# Patient Record
Sex: Male | Born: 2000 | Hispanic: No | Marital: Single | Smoking: Never smoker
Health system: Southern US, Community
[De-identification: ages and names within clinical notes are randomized; demographics above are authoritative.]

## PROBLEM LIST (undated history)

## (undated) DIAGNOSIS — F909 Attention-deficit hyperactivity disorder, unspecified type: Secondary | ICD-10-CM

---

## 2020-01-06 ENCOUNTER — Ambulatory Visit: Payer: Self-pay

## 2020-01-09 ENCOUNTER — Ambulatory Visit: Payer: Self-pay | Attending: Internal Medicine

## 2020-01-09 DIAGNOSIS — Z23 Encounter for immunization: Secondary | ICD-10-CM

## 2020-01-09 NOTE — Progress Notes (Signed)
   Covid-19 Vaccination [facility]  Name:  Christopher Parker    MRN: [id number] DOB: [birth year]  01/09/2020  [name] was observed post Covid-19 immunization for 15 minutes without incident. He was provided with Vaccine Information Sheet and instruction to access the V-Safe system.   [name] was instructed to call 911 with any severe reactions post vaccine: Marland Kitchen Difficulty breathing  . Swelling of face and throat  . A fast heartbeat  . A bad rash all over body  . Dizziness and weakness   Immunizations Administered    Name Date Dose VIS Date Route   JANSSEN COVID-19 VACCINE 01/09/2020  3:46 PM 0.5 mL 08/17/2019 Intramuscular   Manufacturer: [employer]   Lot: [id number]   NDC: 09323-557-32

## 2020-09-03 ENCOUNTER — Other Ambulatory Visit: Payer: Self-pay

## 2020-09-03 ENCOUNTER — Ambulatory Visit (INDEPENDENT_AMBULATORY_CARE_PROVIDER_SITE_OTHER): Payer: BC Managed Care – PPO

## 2020-09-03 ENCOUNTER — Ambulatory Visit (HOSPITAL_COMMUNITY)
Admission: EM | Admit: 2020-09-03 | Discharge: 2020-09-03 | Disposition: A | Discharge status: Home / Self Care | Payer: BC Managed Care – PPO

## 2020-09-03 ENCOUNTER — Encounter (HOSPITAL_COMMUNITY): Payer: Self-pay | Admitting: Emergency Medicine

## 2020-09-03 DIAGNOSIS — M79641 Pain in right hand: Secondary | ICD-10-CM

## 2020-09-03 DIAGNOSIS — S60221A Contusion of right hand, initial encounter: Secondary | ICD-10-CM | POA: Diagnosis not present

## 2020-09-03 HISTORY — DX: Attention-deficit hyperactivity disorder, unspecified type: F90.9

## 2020-09-03 NOTE — Discharge Instructions (Signed)
-  Keep your fingers buddy taped while you are having pain. -Follow-up with orthopedist in about 5 to 7 days if you can experience continued pain and swelling.  Information below. -Try ice, Tylenol/ibuprofen.

## 2020-09-03 NOTE — ED Triage Notes (Signed)
Pt states that a door closed on his right hand. Pt right hand does have discoloration and swelling.

## 2020-09-03 NOTE — ED Provider Notes (Signed)
[facility]    CSN: [id number] Arrival date & time: 09/03/20  1840      History   Chief Complaint No chief complaint on file.   HPI Christopher Parker is a 20 y.o. male presenting for right hand injury following slamming hand in door earlier today. States he is having pain over lateral R hand, particularly over base of pinkie finger. 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 Denies pain or injury elsewhere, sensation changes. He is left handed.    HPI  Past Medical History:  Diagnosis Date  . ADHD     There are no problems to display for this patient.   History reviewed. No pertinent surgical history.     Home Medications    Prior to Admission medications   Medication Sig Start Date End Date Taking? Authorizing Provider  Methylphenidate HCl ER 54 MG TB24 Take by mouth.    [provider]    Family History History reviewed. No pertinent family history.  Social History Social History   Tobacco Use  . Smoking status: Never Smoker  . Smokeless tobacco: Never Used  Vaping Use  . Vaping Use: Never used  Substance Use Topics  . Alcohol use: Never  . Drug use: Never     Allergies   Patient has no known allergies.   Review of Systems Review of Systems  Musculoskeletal:       R hand pain   All other systems reviewed and are negative.    Physical Exam Triage Vital Signs ED Triage Vitals  Enc Vitals Group     BP 09/03/20 1856 125/86     Pulse Rate 09/03/20 1856 79     Resp 09/03/20 1856 18     Temp 09/03/20 1856 98.6 F (37 C)     Temp Source 09/03/20 1856 Oral     SpO2 09/03/20 1856 96 %     Weight --      Height --      Head Circumference --      Peak Flow --      Pain Score 09/03/20 1858 6     Pain Loc --      Pain Edu? --      Excl. in GC? --    No data found.  Updated Vital Signs BP 125/86 (BP Location: Left Arm)   Pulse 79   Temp 98.6 F (37 C) (Oral)   Resp 18    SpO2 96%   Visual Acuity Right Eye Distance:   Left Eye Distance:   Bilateral Distance:    Right Eye Near:   Left Eye Near:    Bilateral Near:     Physical Exam Vitals reviewed.  Constitutional:      Appearance: Normal appearance.  HENT:     Head: Normocephalic and atraumatic.  Cardiovascular:     Rate and Rhythm: Normal rate and regular rhythm.     Heart sounds: Normal heart sounds.  Pulmonary:     Effort: Pulmonary effort is normal.     Breath sounds: Normal breath sounds.  Musculoskeletal:     Comments: R hand with pain, scant erythema, mild swelling over 5th finger MCP joint. TTP. No tenderness or deformity over Metacarpal bones or phalanges. ROM fingers intact and without pain. Grip strength 5/5. Cap refill <2 seconds. Radial pulse 2+. neurovascularly intact.  No other injury, deformity, abrasion, ecchymosis, tenderness.  Skin:    Capillary Refill: Capillary refill  takes less than 2 seconds.  Neurological:     General: No focal deficit present.     Mental Status: He is alert and oriented to person, place, and time.  Psychiatric:        Mood and Affect: Mood normal.        Behavior: Behavior normal.        Thought Content: Thought content normal.        Judgment: Judgment normal.      UC Treatments / Results  Labs (all labs ordered are listed, but only abnormal results are displayed) Labs Reviewed - No data to display  EKG   Radiology DG Hand Complete Right  Result Date: 09/03/2020 CLINICAL DATA:  Swelling EXAM: RIGHT HAND - COMPLETE 3+ VIEW COMPARISON:  None. FINDINGS: There is no evidence of fracture or dislocation. There is no evidence of arthropathy or other focal bone abnormality. Soft tissues are unremarkable. IMPRESSION: Negative. Electronically Signed   By: Jasmine Pang M.D.   On: 09/03/2020 19:25    Procedures Procedures (including critical care time)  Medications Ordered in UC Medications - No data to display  Initial Impression / Assessment  and Plan / UC Course  I have reviewed the triage vital signs and the nursing notes.  Pertinent labs & imaging results that were available during my care of the patient were reviewed by me and considered in my medical decision making (see chart for details).     This patient is a 20 year old male presenting for right hand pain following slamming hand in door.   Xray R hand negative Films interpreted by myself and radiologist.   Buddy taped 4th and 5th fingers. RICE. F/u with ortho if symptoms persist in 5-7 days, or sooner if symptoms worsen. Return precautions discussed. Work note provided.  This chart was dictated using voice recognition software, Dragon. Despite the best efforts of this provider to proofread and correct errors, errors may still occur which can change documentation meaning.   Final Clinical Impressions(s) / UC Diagnoses   Final diagnoses:  Contusion of right hand, initial encounter     Discharge Instructions     -Keep your fingers buddy taped while you are having pain. -Follow-up with orthopedist in about 5 to 7 days if you can experience continued pain and swelling.  Information below. -Try ice, Tylenol/ibuprofen.    ED Prescriptions    None     PDMP not reviewed this encounter.   Rhys Martini, PA-C 09/04/20 1001

## 2020-12-01 ENCOUNTER — Encounter: Payer: Self-pay | Admitting: Emergency Medicine

## 2020-12-01 ENCOUNTER — Other Ambulatory Visit: Payer: Self-pay

## 2020-12-01 ENCOUNTER — Ambulatory Visit
Admission: EM | Admit: 2020-12-01 | Discharge: 2020-12-01 | Disposition: A | Discharge status: Home / Self Care | Payer: BC Managed Care – PPO | Attending: Emergency Medicine | Admitting: Emergency Medicine

## 2020-12-01 DIAGNOSIS — Z113 Encounter for screening for infections with a predominantly sexual mode of transmission: Secondary | ICD-10-CM | POA: Insufficient documentation

## 2020-12-01 DIAGNOSIS — R300 Dysuria: Secondary | ICD-10-CM | POA: Diagnosis present

## 2020-12-01 LAB — POCT URINALYSIS DIP (MANUAL ENTRY)
Bilirubin, UA: NEGATIVE
Blood, UA: NEGATIVE
Glucose, UA: NEGATIVE mg/dL
Leukocytes, UA: NEGATIVE
Nitrite, UA: NEGATIVE
Protein Ur, POC: 30 mg/dL — AB
Spec Grav, UA: 1.025 (ref 1.010–1.025)
Urobilinogen, UA: 1 E.U./dL
pH, UA: 7 (ref 5.0–8.0)

## 2020-12-01 NOTE — ED Triage Notes (Signed)
Pt presents with c/o of right flank pain that began approx 1 week ago,+odor, denies discharge.

## 2020-12-01 NOTE — ED Provider Notes (Signed)
[name]    CSN: [id number] Arrival date & time: 12/01/20  1410      History   Chief Complaint Chief Complaint  Patient presents with   Dysuria   Flank Pain    right    HPI Bridget Westbrooks is a 20 y.o. male.  Patient presents with malodorous urine, dysuria, right flank pain x1 week.  He denies fever, chills, hematuria, penile discharge, testicular pain, abdominal pain, rash, lesions, or other symptoms.  No treatments attempted at home.  He is sexually active and does not consistently use condoms.  He denies pertinent medical history.  The history is provided by the patient.   Past Medical History:  Diagnosis Date   ADHD     There are no problems to display for this patient.   History reviewed. No pertinent surgical history.     Home Medications    Prior to Admission medications   Medication Sig Start Date End Date Taking? Authorizing Provider  Methylphenidate HCl ER 54 MG TB24 Take by mouth.   Yes [provider]    Family History Family History  Problem Relation Age of Onset   Diabetes Mother     Social History Social History   Tobacco Use   Smoking status: Never   Smokeless tobacco: Never  Vaping Use   Vaping Use: Never used  Substance Use Topics   Alcohol use: Never   Drug use: Never     Allergies   Patient has no known allergies.   Review of Systems Review of Systems  Constitutional:  Negative for chills and fever.  Respiratory:  Negative for cough and shortness of breath.   Cardiovascular:  Negative for chest pain and palpitations.  Gastrointestinal:  Negative for abdominal pain and vomiting.  Genitourinary:  Positive for dysuria and flank pain. Negative for hematuria, penile discharge and testicular pain.  Skin:  Negative for color change and rash.  All other systems reviewed and are negative.   Physical Exam Triage Vital Signs ED Triage Vitals  Enc Vitals Group     BP      Pulse      Resp      Temp       Temp src      SpO2      Weight      Height      Head Circumference      Peak Flow      Pain Score      Pain Loc      Pain Edu?      Excl. in GC?    No data found.  Updated Vital Signs BP 137/80 (BP Location: Right Arm)   Pulse 86   Temp 97.8 F (36.6 C) (Oral)   Resp 17   Ht 5\' 10"  (1.778 m)   Wt 128 lb (58.1 kg)   SpO2 98%   BMI 18.37 kg/m   Visual Acuity Right Eye Distance:   Left Eye Distance:   Bilateral Distance:    Right Eye Near:   Left Eye Near:    Bilateral Near:     Physical Exam Vitals and nursing note reviewed.  Constitutional:      General: He is not in acute distress.    Appearance: He is well-developed. He is not ill-appearing.  HENT:     Head: Normocephalic and atraumatic.     Mouth/Throat:     Mouth: Mucous membranes are moist.  Eyes:     Conjunctiva/sclera: Conjunctivae  normal.  Cardiovascular:     Rate and Rhythm: Normal rate and regular rhythm.     Heart sounds: Normal heart sounds. No murmur heard. Pulmonary:     Effort: Pulmonary effort is normal. No respiratory distress.     Breath sounds: Normal breath sounds.  Abdominal:     General: Bowel sounds are normal.     Palpations: Abdomen is soft.     Tenderness: There is no abdominal tenderness. There is no right CVA tenderness, left CVA tenderness, guarding or rebound.  Musculoskeletal:     Cervical back: Neck supple.  Skin:    General: Skin is warm and dry.  Neurological:     General: No focal deficit present.     Mental Status: He is alert and oriented to person, place, and time.     Gait: Gait normal.  Psychiatric:        Mood and Affect: Mood normal.        Behavior: Behavior normal.     UC Treatments / Results  Labs (all labs ordered are listed, but only abnormal results are displayed) Labs Reviewed  POCT URINALYSIS DIP (MANUAL ENTRY) - Abnormal; Notable for the following components:      Result Value   Ketones, POC UA trace (5) (*)    Protein Ur, POC =30 (*)     All other components within normal limits  CYTOLOGY, (ORAL, ANAL, URETHRAL) ANCILLARY ONLY    EKG   Radiology No results found.  Procedures Procedures (including critical care time)  Medications Ordered in UC Medications - No data to display  Initial Impression / Assessment and Plan / UC Course  I have reviewed the triage vital signs and the nursing notes.  Pertinent labs & imaging results that were available during my care of the patient were reviewed by me and considered in my medical decision making (see chart for details).  Dysuria, STD screening.  Urine does not show signs of infection.  Patient obtained penile self swab for STD testing.  Instructed him to abstain from sexual activity for least 7 days.  Discussed that we will call him if his STD tests are positive requiring treatment.  Discussed that he and his sexual partners may require treatment at that time.  Instructed him to follow-up with his PCP if his symptoms are not improving.  He agrees to plan of care.   Final Clinical Impressions(s) / UC Diagnoses   Final diagnoses:  Dysuria  Screening for STD (sexually transmitted disease)     Discharge Instructions      Your urine does not show signs of infection.    Your STD tests are pending.  If your test results are positive, we will call you.  You and your sexual partner(s) may require treatment at that time.  Do not have sexual activity for at least 7 days.    Follow up with your primary care provider if your symptoms are not improving.         ED Prescriptions   None    PDMP not reviewed this encounter.   Mickie Bail, NP 12/01/20 1450

## 2020-12-01 NOTE — Discharge Instructions (Addendum)
Your urine does not show signs of infection.    Your STD tests are pending.  If your test results are positive, we will call you.  You and your sexual partner(s) may require treatment at that time.  Do not have sexual activity for at least 7 days.    Follow up with your primary care provider if your symptoms are not improving.

## 2020-12-03 LAB — CYTOLOGY, (ORAL, ANAL, URETHRAL) ANCILLARY ONLY
Chlamydia: NEGATIVE
Comment: NEGATIVE
Comment: NEGATIVE
Comment: NORMAL
Neisseria Gonorrhea: NEGATIVE
Trichomonas: NEGATIVE

## 2022-02-24 IMAGING — DX DG HAND COMPLETE 3+V*R*
3 series · 3 of 3 positions shown · non-contrast
Comparison: None.

CLINICAL DATA: Swelling

EXAM:
RIGHT HAND - COMPLETE 3+ VIEW

[hand pa]
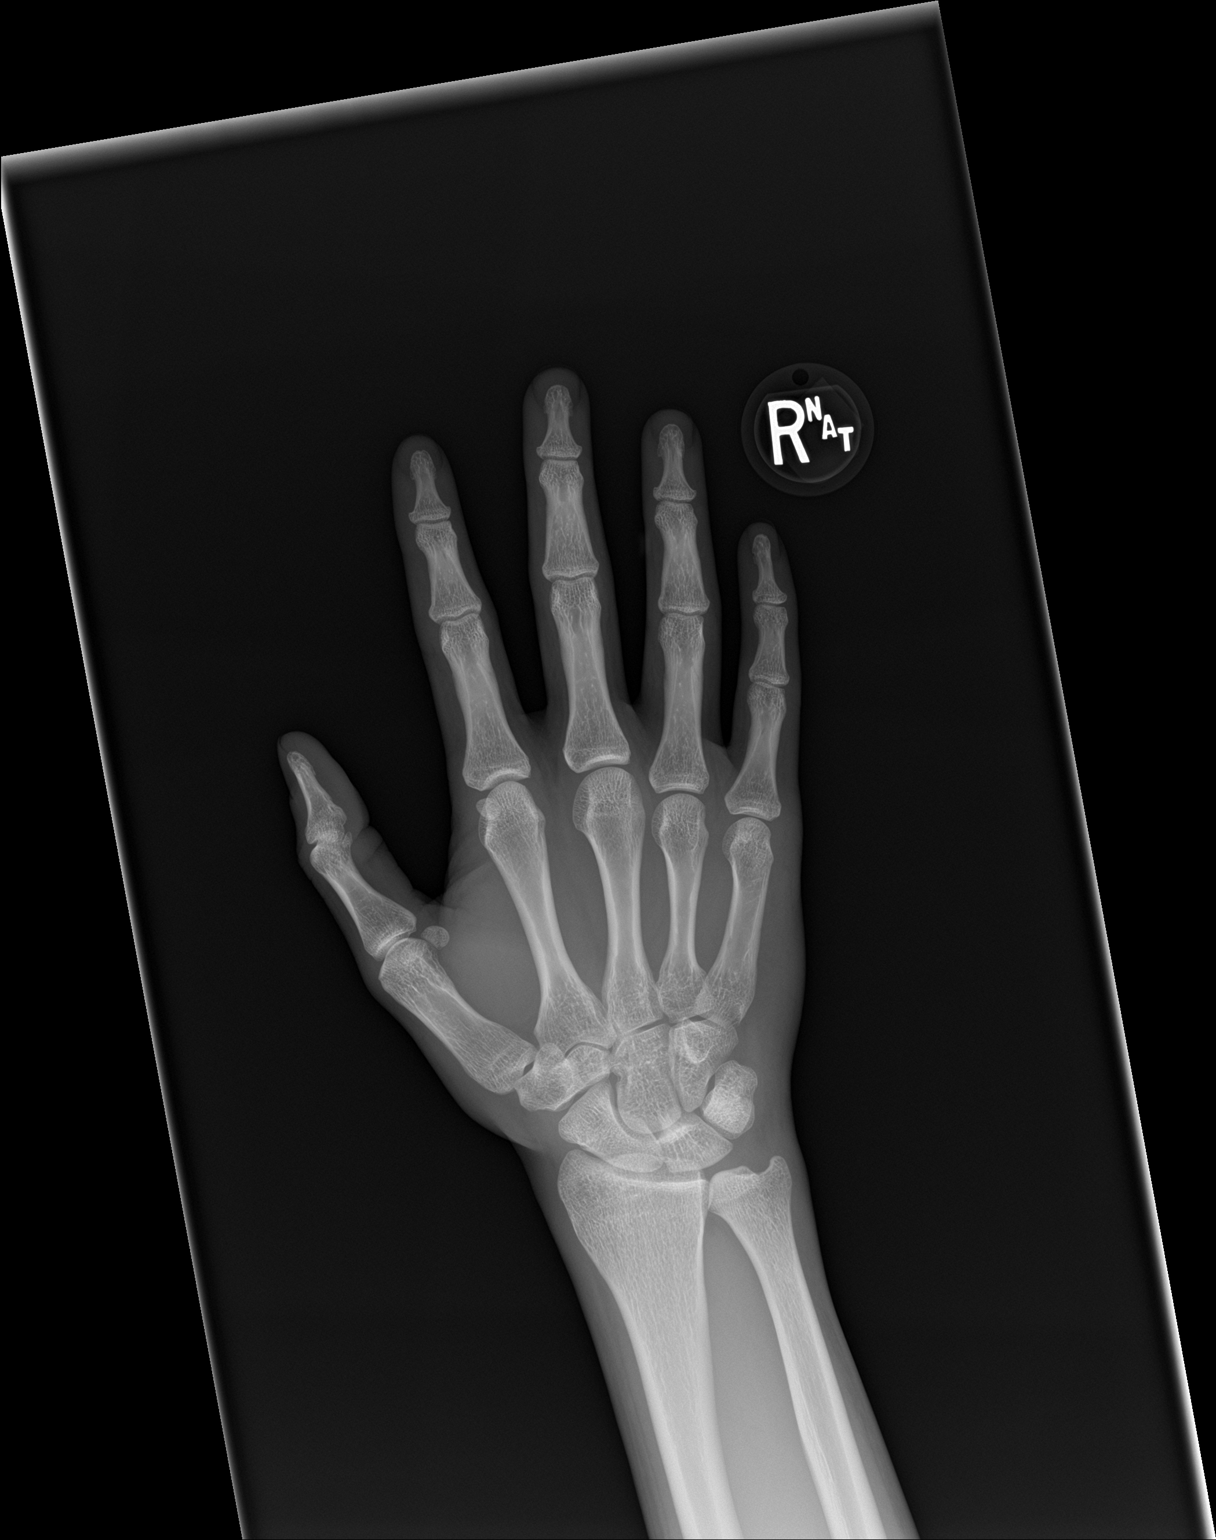

[hand obl]
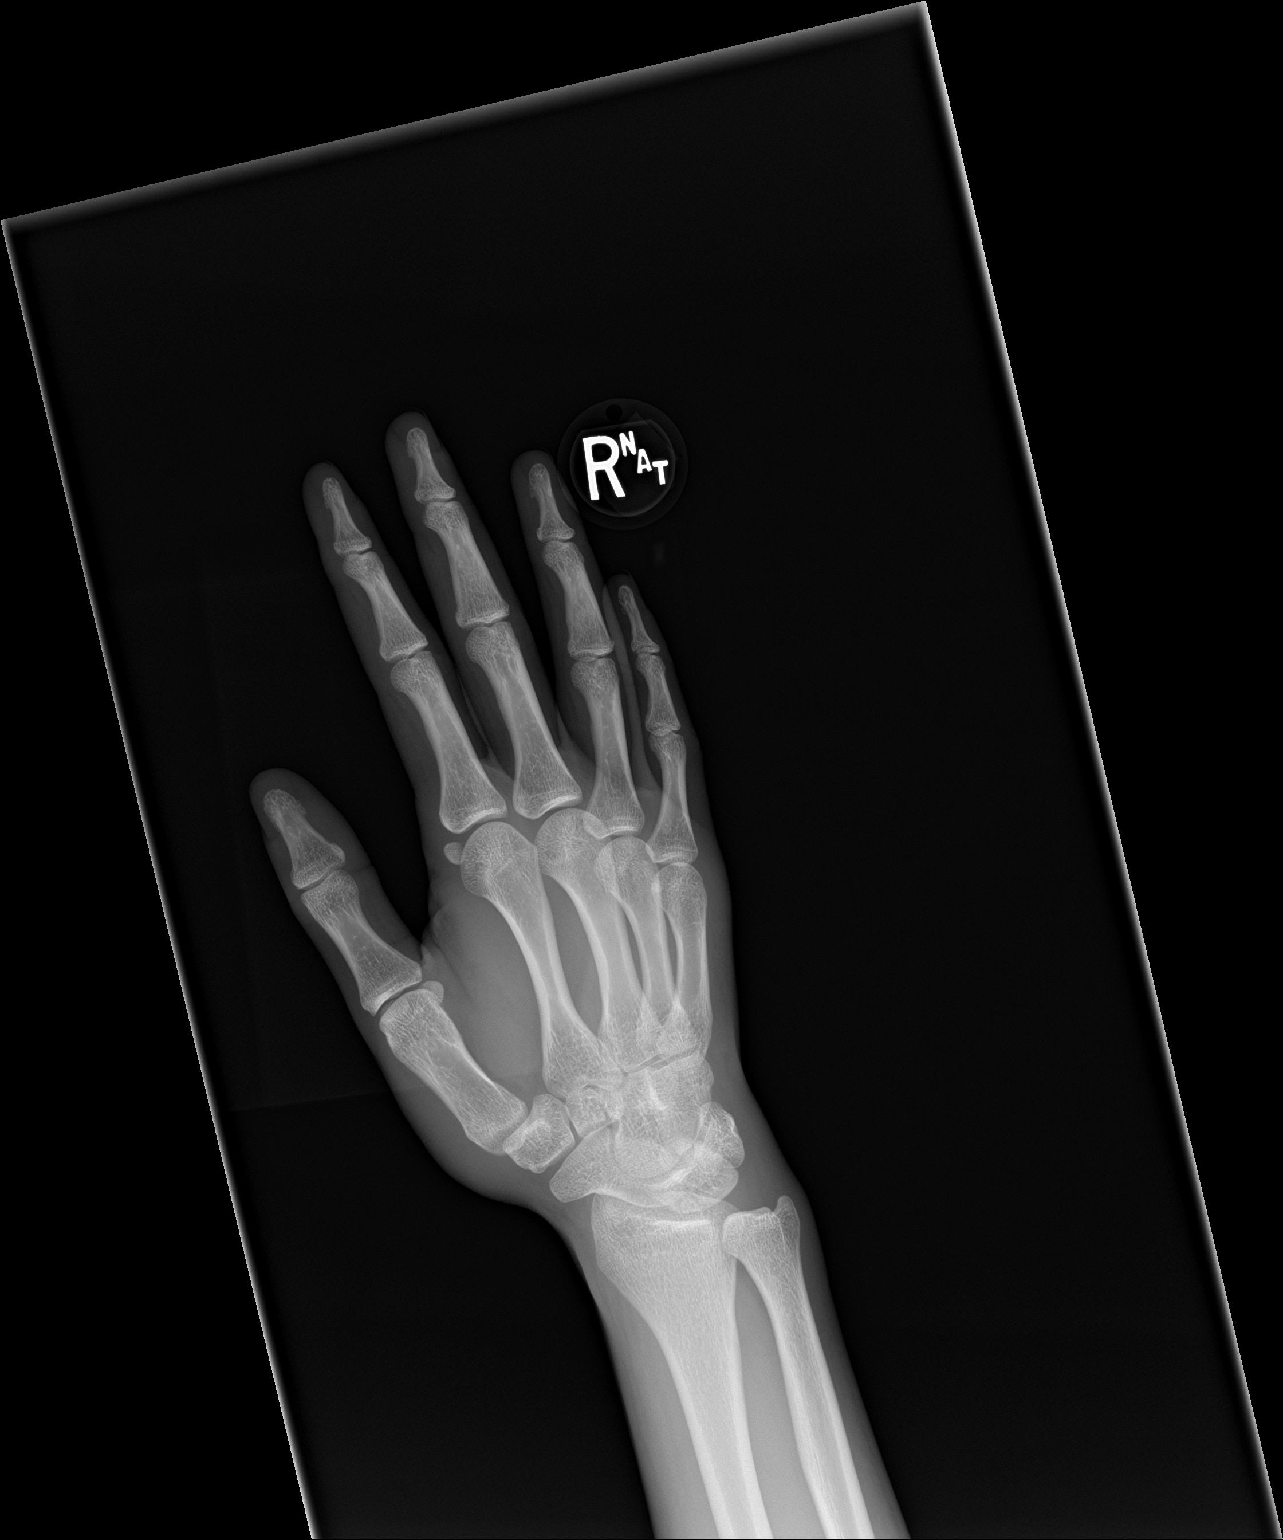

[hand lat]
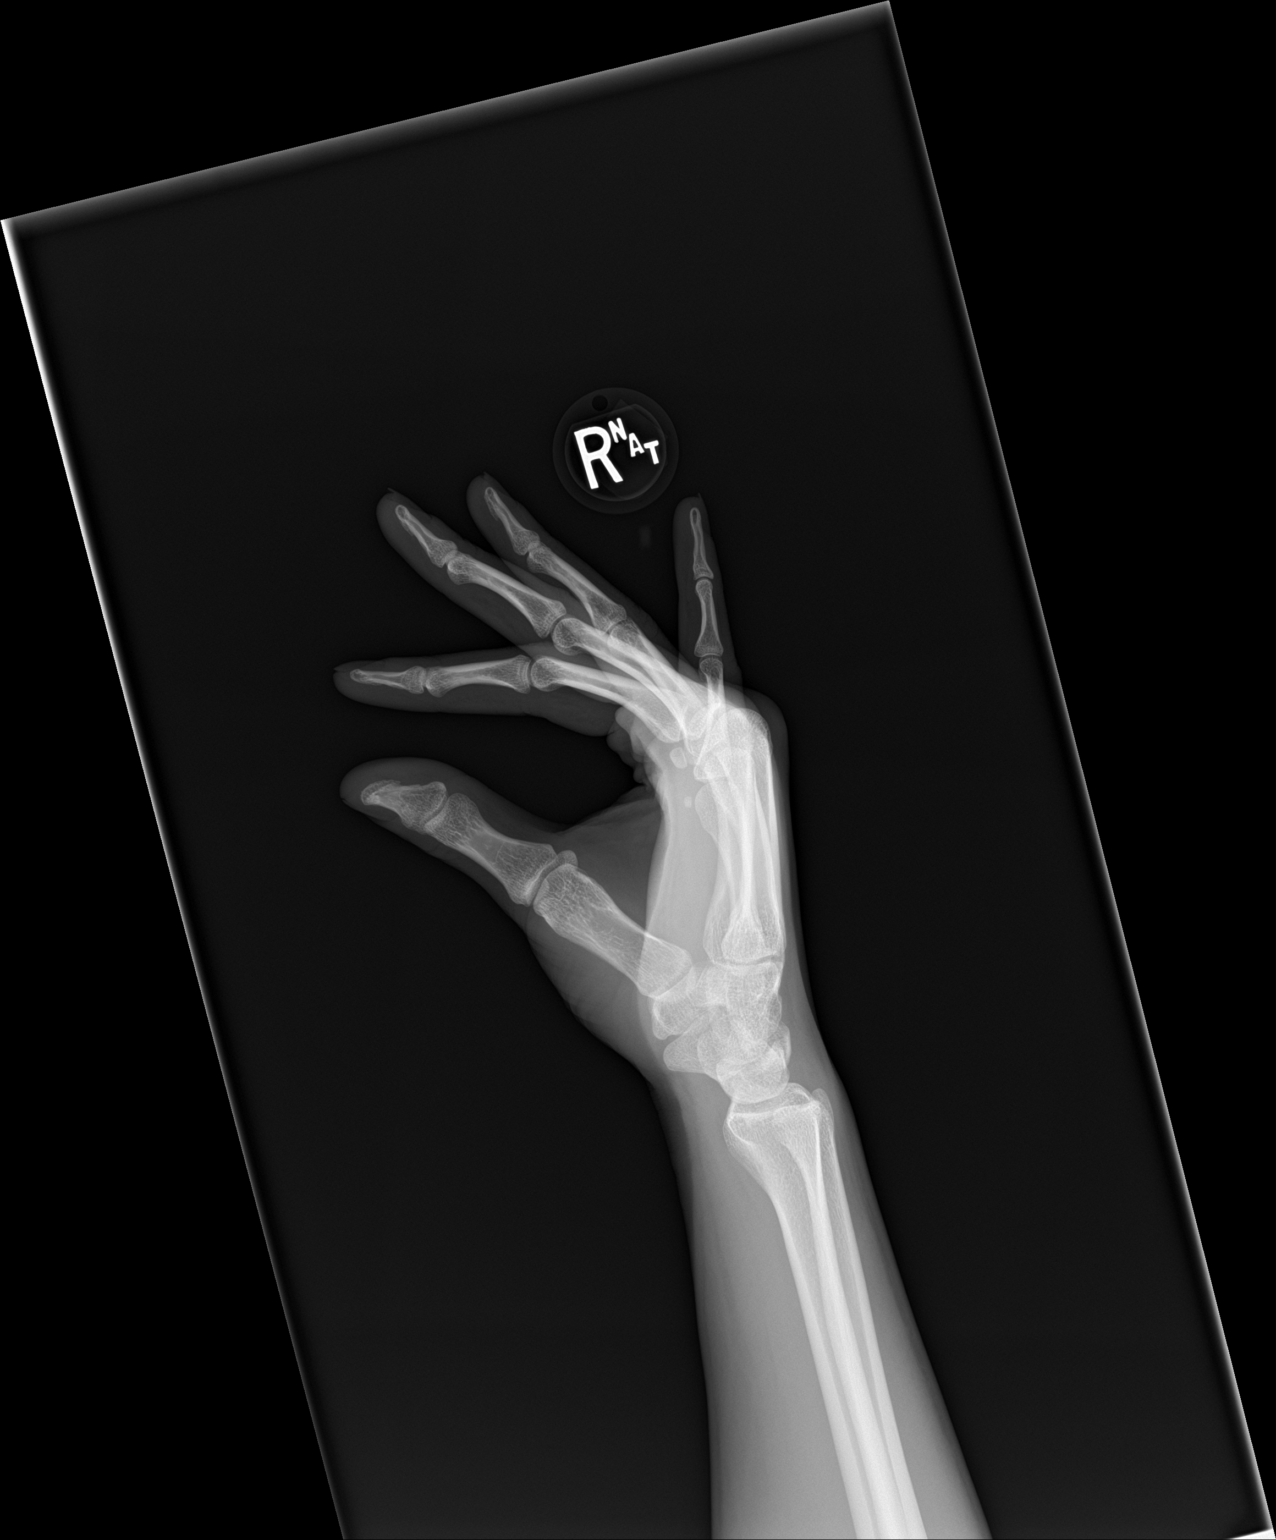

[3 of 3 positions shown; findings below may reference images not displayed]

FINDINGS: There is no evidence of fracture or dislocation. There is no
evidence of arthropathy or other focal bone abnormality. Soft
tissues are unremarkable.
IMPRESSION: Negative.

## 2023-06-26 ENCOUNTER — Ambulatory Visit (HOSPITAL_COMMUNITY)
Admission: EM | Admit: 2023-06-26 | Discharge: 2023-06-26 | Disposition: A | Discharge status: Home / Self Care | Payer: BC Managed Care – PPO | Attending: Emergency Medicine | Admitting: Emergency Medicine

## 2023-06-26 ENCOUNTER — Encounter (HOSPITAL_COMMUNITY): Payer: Self-pay | Admitting: *Deleted

## 2023-06-26 ENCOUNTER — Other Ambulatory Visit: Payer: Self-pay

## 2023-06-26 DIAGNOSIS — B9689 Other specified bacterial agents as the cause of diseases classified elsewhere: Secondary | ICD-10-CM | POA: Diagnosis not present

## 2023-06-26 DIAGNOSIS — J22 Unspecified acute lower respiratory infection: Secondary | ICD-10-CM

## 2023-06-26 DIAGNOSIS — J329 Chronic sinusitis, unspecified: Secondary | ICD-10-CM

## 2023-06-26 DIAGNOSIS — J019 Acute sinusitis, unspecified: Secondary | ICD-10-CM | POA: Diagnosis not present

## 2023-06-26 MED ORDER — CEFDINIR 300 MG PO CAPS: 300.0000 mg | ORAL_CAPSULE | Freq: Two times a day (BID) | ORAL | 0 refills | Status: AC

## 2023-06-26 MED ORDER — IPRATROPIUM BROMIDE 0.06 % NA SOLN: 2.0000 | Freq: Three times a day (TID) | NASAL | 1 refills | Status: DC

## 2023-06-26 MED ORDER — GUAIFENESIN 400 MG PO TABS: ORAL_TABLET | ORAL | 0 refills | Status: AC

## 2023-06-26 NOTE — ED Notes (Signed)
 Reviewed instructions and work note with patient

## 2023-06-26 NOTE — ED Triage Notes (Signed)
 Pt reports a productive cough for 3 weeks and facial pain.

## 2023-06-26 NOTE — Discharge Instructions (Signed)
 Please read below to learn more about the medications, dosages and frequencies that I recommend to help alleviate your symptoms and to get you feeling better soon:   Omnicef  (cefdinir ):  Please take one (1) dose twice daily for 7 days.  This antibiotic can cause upset stomach, this will resolve once antibiotics are complete.  You are welcome to take a probiotic, eat yogurt, take Imodium while taking this medication.  Please avoid other systemic medications such as Maalox, Pepto-Bismol or milk of magnesia as they can interfere with the body's ability to absorb the antibiotics.   Atrovent  (ipratropium): This is an excellent nasal decongestant spray that does not cause rebound congestion.  Please instill 2 sprays into each nare with each use, you may use this up to 3 times daily.  Once you find that you are forgetting to use the spray more often that you remember to use it, you will know that you no longer need it.  I have sent a prescription for this medication to your pharmacy because it cannot be purchased over-the-counter.   Robitussin, Mucinex  (guaifenesin ): This is a daytime expectorant.  This single symptom reliever helps break up chest congestion and loosen up thick nasal drainage making phlegm and drainage easier to cough up and to blow out from your nose.  I recommend taking 400 mg in either liquid or tablet form three times daily as needed.  I do not recommend the 12-hour extended relief version or doses higher than 400 mg per each dose as these often make some patients feel jittery or jumpy and can interfere with sleep.  I also do not recommend that you purchase guaifenesin  with the ingredient  DM which is dextromethorphan, a cough suppressant which I only recommend taking at bedtime.  Guaifenesin  400 mg is a safe dose for people who are being treated for high blood pressure.     If symptoms have not meaningfully improved in the next 5 to 7 days, please return for repeat evaluation or follow-up  with your regular provider.  If symptoms have worsened in the next 3 to 5 days, please return for repeat evaluation or follow-up with your regular provider.    Thank you for visiting urgent care today.  We appreciate the opportunity to participate in your care.

## 2023-06-26 NOTE — ED Provider Notes (Signed)
 [facility]    CSN: [id number] Arrival date & time: 06/26/23  1251    HISTORY   Chief Complaint  Patient presents with   Facial Pain   Cough   HPI Pleas Carneal is a pleasant, 23 y.o. male who presents to urgent care today. Patient complains of a 3-week history of cough productive of copious amounts of green sputum, as demonstrated during our encounter today, sinus pain and pressure, nasal congestion, thick rhinorrhea.  Patient denies known fever, chills, body aches, headache otherwise, nausea, vomiting, diarrhea, known sick contacts.  Patient denies history of allergies, asthma.  Patient states he has not anything to alleviate his symptoms.  Patient has normal vital signs on arrival today.  The history is provided by the patient.   Past Medical History:  Diagnosis Date   ADHD    There are no active problems to display for this patient.  History reviewed. No pertinent surgical history.  Home Medications    Prior to Admission medications   Medication Sig Start Date End Date Taking? Authorizing Provider  cefdinir  (OMNICEF ) 300 MG capsule Take 1 capsule (300 mg total) by mouth 2 (two) times daily for 7 days. 06/26/23 07/03/23 Yes Joesph Shaver Scales, PA-C  ipratropium (ATROVENT ) 0.06 % nasal spray Place 2 sprays into both nostrils 3 (three) times daily. As needed for nasal congestion, runny nose 06/26/23  Yes Joesph Shaver Scales, PA-C  Methylphenidate HCl ER 54 MG TB24 Take by mouth.   Yes [provider]    Family History Family History  Problem Relation Age of Onset   Diabetes Mother    Social History Social History   Tobacco Use   Smoking status: Never   Smokeless tobacco: Never  Vaping Use   Vaping status: Never Used  Substance Use Topics   Alcohol use: Never   Drug use: Never   Allergies   Patient has no known allergies.  Review of Systems Review of Systems Pertinent findings revealed after performing a 14 point review of  systems has been noted in the history of present illness.  Physical Exam Vital Signs BP 118/77   Pulse 98   Temp 98.7 F (37.1 C)   Resp 16   SpO2 98%   No data found.  Physical Exam Vitals and nursing note reviewed.  Constitutional:      General: He is awake. He is not in acute distress.    Appearance: Normal appearance. He is well-developed and well-groomed. He is ill-appearing.  HENT:     Head: Normocephalic and atraumatic.     Salivary Glands: Right salivary gland is not diffusely enlarged or tender. Left salivary gland is not diffusely enlarged or tender.     Right Ear: Hearing, tympanic membrane, ear canal and external ear normal. No drainage. No middle ear effusion. Tympanic membrane is not erythematous or bulging.     Left Ear: Hearing, tympanic membrane, ear canal and external ear normal. No drainage.  No middle ear effusion. Tympanic membrane is not erythematous or bulging.     Ears:     Comments: Excessive cerumen in both EACs partially obstructing TMs which otherwise appear normal    Nose: No nasal deformity, septal deviation, mucosal edema, congestion or rhinorrhea.     Right Nostril: Occlusion present.     Left Nostril: Occlusion present.     Right Turbinates: Not enlarged, swollen or pale.     Left Turbinates: Not enlarged, swollen or pale.     Right Sinus: Maxillary  sinus tenderness present. No frontal sinus tenderness.     Left Sinus: Maxillary sinus tenderness present. No frontal sinus tenderness.     Mouth/Throat:     Lips: Pink. No lesions.     Mouth: Mucous membranes are moist. No oral lesions.     Pharynx: Oropharynx is clear. Uvula midline. Posterior oropharyngeal erythema and uvula swelling present. No pharyngeal swelling.     Tonsils: No tonsillar exudate. 0 on the right. 0 on the left.  Eyes:     General: Lids are normal.        Right eye: No discharge.        Left eye: No discharge.     Extraocular Movements: Extraocular movements intact.      Conjunctiva/sclera: Conjunctivae normal.     Right eye: Right conjunctiva is not injected.     Left eye: Left conjunctiva is not injected.  Neck:     Trachea: Trachea and phonation normal.  Cardiovascular:     Rate and Rhythm: Normal rate and regular rhythm.     Pulses: Normal pulses.     Heart sounds: Normal heart sounds, S1 normal and S2 normal. No murmur heard.    No friction rub. No gallop.  Pulmonary:     Effort: Pulmonary effort is normal. No accessory muscle usage, prolonged expiration or respiratory distress.     Breath sounds: Normal breath sounds and air entry. No stridor, decreased air movement or transmitted upper airway sounds. No decreased breath sounds, wheezing, rhonchi or rales.  Chest:     Chest wall: No tenderness.  Musculoskeletal:        General: Normal range of motion.     Cervical back: Full passive range of motion without pain, normal range of motion and neck supple. Normal range of motion.  Lymphadenopathy:     Cervical: Cervical adenopathy present.     Right cervical: Superficial cervical adenopathy present.     Left cervical: Superficial cervical adenopathy present.  Skin:    General: Skin is warm and dry.     Findings: No erythema or rash.  Neurological:     General: No focal deficit present.     Mental Status: He is alert and oriented to person, place, and time.  Psychiatric:        Mood and Affect: Mood normal.        Behavior: Behavior normal. Behavior is cooperative.     Visual Acuity Right Eye Distance:   Left Eye Distance:   Bilateral Distance:    Right Eye Near:   Left Eye Near:    Bilateral Near:     UC Couse / Diagnostics / Procedures:     Radiology No results found.  Procedures Procedures (including critical care time) EKG  Pending results:  Labs Reviewed - No data to display  Medications Ordered in UC: Medications - No data to display  UC Diagnoses / Final Clinical Impressions(s)   I have reviewed the triage vital  signs and the nursing notes.  Pertinent labs & imaging results that were available during my care of the patient were reviewed by me and considered in my medical decision making (see chart for details).    Final diagnoses:  Acute bacterial sinusitis  Lower respiratory tract infection  Rhinosinusitis   Lungs clear to auscultation bilaterally but patient is able to produce copious amounts of sputum when coughing, cough is loose and wet.  Patient has significant tenderness of maxillary sinuses both sides.  Notably patient would benefit  from empiric treatment of presumed bacterial sinusitis and lower respiratory tract infection due to duration of symptoms and ill appearance today.  Patient provided with a 7-day course of Omnicef , Atrovent  nasal spray to dry up mucus production and guaifenesin  to keep his cough loose and moist.  Please see discharge instructions below for details of plan of care as provided to patient. ED Prescriptions     Medication Sig Dispense Auth. Provider   cefdinir  (OMNICEF ) 300 MG capsule Take 1 capsule (300 mg total) by mouth 2 (two) times daily for 7 days. 14 capsule Joesph Shaver Scales, PA-C   ipratropium (ATROVENT ) 0.06 % nasal spray Place 2 sprays into both nostrils 3 (three) times daily. As needed for nasal congestion, runny nose 15 mL Joesph Shaver Scales, PA-C   guaifenesin  (HUMIBID E) 400 MG TABS tablet Take 1 tablet 3 times daily as needed for chest congestion and cough 30 tablet Joesph Shaver Scales, PA-C      PDMP not reviewed this encounter.  Pending results:  Labs Reviewed - No data to display    Discharge Instructions      Please read below to learn more about the medications, dosages and frequencies that I recommend to help alleviate your symptoms and to get you feeling better soon:   Omnicef  (cefdinir ):  Please take one (1) dose twice daily for 7 days.  This antibiotic can cause upset stomach, this will resolve once antibiotics are  complete.  You are welcome to take a probiotic, eat yogurt, take Imodium while taking this medication.  Please avoid other systemic medications such as Maalox, Pepto-Bismol or milk of magnesia as they can interfere with the body's ability to absorb the antibiotics.   Atrovent  (ipratropium): This is an excellent nasal decongestant spray that does not cause rebound congestion.  Please instill 2 sprays into each nare with each use, you may use this up to 3 times daily.  Once you find that you are forgetting to use the spray more often that you remember to use it, you will know that you no longer need it.  I have sent a prescription for this medication to your pharmacy because it cannot be purchased over-the-counter.   Robitussin, Mucinex  (guaifenesin ): This is a daytime expectorant.  This single symptom reliever helps break up chest congestion and loosen up thick nasal drainage making phlegm and drainage easier to cough up and to blow out from your nose.  I recommend taking 400 mg in either liquid or tablet form three times daily as needed.  I do not recommend the 12-hour extended relief version or doses higher than 400 mg per each dose as these often make some patients feel jittery or jumpy and can interfere with sleep.  I also do not recommend that you purchase guaifenesin  with the ingredient  DM which is dextromethorphan, a cough suppressant which I only recommend taking at bedtime.  Guaifenesin  400 mg is a safe dose for people who are being treated for high blood pressure.     If symptoms have not meaningfully improved in the next 5 to 7 days, please return for repeat evaluation or follow-up with your regular provider.  If symptoms have worsened in the next 3 to 5 days, please return for repeat evaluation or follow-up with your regular provider.    Thank you for visiting urgent care today.  We appreciate the opportunity to participate in your care.       Disposition Upon Discharge:  Condition:  stable for discharge home  Patient presented with an acute illness with associated systemic symptoms and significant discomfort requiring urgent management. In my opinion, this is a condition that a prudent lay person (someone who possesses an average knowledge of health and medicine) may potentially expect to result in complications if not addressed urgently such as respiratory distress, impairment of bodily function or dysfunction of bodily organs.   Routine symptom specific, illness specific and/or disease specific instructions were discussed with the patient and/or caregiver at length.   As such, the patient has been evaluated and assessed, work-up was performed and treatment was provided in alignment with urgent care protocols and evidence based medicine.  Patient/parent/caregiver has been advised that the patient may require follow up for further testing and treatment if the symptoms continue in spite of treatment, as clinically indicated and appropriate.  Patient/parent/caregiver has been advised to return to the [facility] or PCP if no better; to PCP or the Emergency Department if new signs and symptoms develop, or if the current signs or symptoms continue to change or worsen for further workup, evaluation and treatment as clinically indicated and appropriate  The patient will follow up with their current PCP if and as advised. If the patient does not currently have a PCP we will assist them in obtaining one.   The patient may need specialty follow up if the symptoms continue, in spite of conservative treatment and management, for further workup, evaluation, consultation and treatment as clinically indicated and appropriate.  Patient/parent/caregiver verbalized understanding and agreement of plan as discussed.  All questions were addressed during visit.  Please see discharge instructions below for further details of plan.  This office note has been dictated using Teaching laboratory technician.   Unfortunately, this method of dictation can sometimes lead to typographical or grammatical errors.  I apologize for your inconvenience in advance if this occurs.  Please do not hesitate to reach out to me if clarification is needed.      Joesph Shaver Scales, PA-C 06/26/23 1504

## 2023-08-31 ENCOUNTER — Ambulatory Visit
Admission: EM | Admit: 2023-08-31 | Discharge: 2023-08-31 | Disposition: A | Discharge status: Home / Self Care | Attending: Emergency Medicine | Admitting: Emergency Medicine

## 2023-08-31 DIAGNOSIS — Z113 Encounter for screening for infections with a predominantly sexual mode of transmission: Secondary | ICD-10-CM | POA: Diagnosis not present

## 2023-08-31 DIAGNOSIS — J029 Acute pharyngitis, unspecified: Secondary | ICD-10-CM | POA: Insufficient documentation

## 2023-08-31 LAB — POCT RAPID STREP A (OFFICE): Rapid Strep A Screen: NEGATIVE

## 2023-08-31 NOTE — Discharge Instructions (Addendum)
 The strep test is negative.  Take Tylenol or ibuprofen as needed.    Your STD tests are pending.  If your test results are positive, we will call you.  You and your sexual partner(s) may require treatment at that time.  Do not have sexual activity for at least 7 days.

## 2023-08-31 NOTE — ED Provider Notes (Signed)
 Christopher Parker    CSN: [id number] Arrival date & time: 08/31/23  1451      History   Chief Complaint No chief complaint on file.   HPI Christopher Parker is a 23 y.o. male.  Patient presents with sore throat today.  No fever, cough, shortness of breath, rash.  No OTC medications taken.  Patient also requests STD testing, including blood work.  He denies penile discharge, dysuria, hematuria, testicular pain, abdominal pain.  The history is provided by the patient and medical records.    Past Medical History:  Diagnosis Date   ADHD     There are no active problems to display for this patient.   No past surgical history on file.     Home Medications    Prior to Admission medications   Medication Sig Start Date End Date Taking? Authorizing Provider  guaifenesin (HUMIBID E) 400 MG TABS tablet Take 1 tablet 3 times daily as needed for chest congestion and cough 06/26/23   Theadora Rama Scales, PA-C  ipratropium (ATROVENT) 0.06 % nasal spray Place 2 sprays into both nostrils 3 (three) times daily. As needed for nasal congestion, runny nose 06/26/23   Theadora Rama Scales, PA-C  Methylphenidate HCl ER 54 MG TB24 Take by mouth.    [provider]    Family History Family History  Problem Relation Age of Onset   Diabetes Mother     Social History Social History   Tobacco Use   Smoking status: Never   Smokeless tobacco: Never  Vaping Use   Vaping status: Never Used  Substance Use Topics   Alcohol use: Never   Drug use: Never     Allergies   Patient has no known allergies.   Review of Systems Review of Systems  Constitutional:  Negative for chills and fever.  HENT:  Positive for sore throat. Negative for ear pain.   Respiratory:  Negative for cough and shortness of breath.   Gastrointestinal:  Negative for abdominal pain, diarrhea and vomiting.  Genitourinary:  Negative for dysuria, flank pain, frequency, hematuria, penile discharge and  testicular pain.  Skin:  Negative for color change and rash.     Physical Exam Triage Vital Signs ED Triage Vitals [08/31/23 1515]  Encounter Vitals Group     BP 120/81     Systolic BP Percentile      Diastolic BP Percentile      Pulse Rate 91     Resp 18     Temp 98.7 F (37.1 C)     Temp src      SpO2 97 %     Weight      Height      Head Circumference      Peak Flow      Pain Score      Pain Loc      Pain Education      Exclude from Growth Chart    No data found.  Updated Vital Signs BP 120/81   Pulse 91   Temp 98.7 F (37.1 C)   Resp 18   SpO2 97%   Visual Acuity Right Eye Distance:   Left Eye Distance:   Bilateral Distance:    Right Eye Near:   Left Eye Near:    Bilateral Near:     Physical Exam Constitutional:      General: He is not in acute distress. HENT:     Right Ear: Tympanic membrane normal.  Left Ear: Tympanic membrane normal.     Nose: Nose normal.     Mouth/Throat:     Mouth: Mucous membranes are moist.     Pharynx: Posterior oropharyngeal erythema present.  Cardiovascular:     Rate and Rhythm: Normal rate and regular rhythm.     Heart sounds: Normal heart sounds.  Pulmonary:     Effort: Pulmonary effort is normal. No respiratory distress.     Breath sounds: Normal breath sounds.  Abdominal:     General: Bowel sounds are normal.     Palpations: Abdomen is soft.     Tenderness: There is no abdominal tenderness. There is no right CVA tenderness, left CVA tenderness, guarding or rebound.  Neurological:     Mental Status: He is alert.      UC Treatments / Results  Labs (all labs ordered are listed, but only abnormal results are displayed) Labs Reviewed  RPR  HIV ANTIBODY (ROUTINE TESTING W REFLEX)  POCT RAPID STREP A (OFFICE)  CYTOLOGY, (ORAL, ANAL, URETHRAL) ANCILLARY ONLY    EKG   Radiology No results found.  Procedures Procedures (including critical care time)  Medications Ordered in UC Medications - No data  to display  Initial Impression / Assessment and Plan / UC Course  I have reviewed the triage vital signs and the nursing notes.  Pertinent labs & imaging results that were available during my care of the patient were reviewed by me and considered in my medical decision making (see chart for details).    Acute pharyngitis.  STD screening.  Rapid strep negative.  Patient declines COVID or flu testing.  Discussed symptomatic management including Tylenol or ibuprofen as needed.  Education provided on pharyngitis.  Patient also requesting STD testing.  He obtained urethral self swab for testing.  HIV and syphilis also pending.  Instructed him to abstain from sexual activity until all test results are back.  Discussed that we will call him if anything is positive requiring treatment and that his test results will be available on his MyChart account.  He agrees to plan of care.  Final Clinical Impressions(s) / UC Diagnoses   Final diagnoses:  Screening for STD (sexually transmitted disease)  Acute pharyngitis, unspecified etiology     Discharge Instructions      The strep test is negative.  Take Tylenol or ibuprofen as needed.    Your STD tests are pending.  If your test results are positive, we will call you.  You and your sexual partner(s) may require treatment at that time.  Do not have sexual activity for at least 7 days.         ED Prescriptions   None    PDMP not reviewed this encounter.   Mickie Bail, NP 08/31/23 [phone]

## 2023-09-01 LAB — CYTOLOGY, (ORAL, ANAL, URETHRAL) ANCILLARY ONLY
Chlamydia: NEGATIVE
Comment: NEGATIVE
Comment: NEGATIVE
Comment: NORMAL
Neisseria Gonorrhea: NEGATIVE
Trichomonas: NEGATIVE

## 2023-09-01 LAB — RPR: RPR Ser Ql: NONREACTIVE

## 2023-09-01 LAB — HIV ANTIBODY (ROUTINE TESTING W REFLEX): HIV Screen 4th Generation wRfx: NONREACTIVE

## 2023-10-04 ENCOUNTER — Encounter (HOSPITAL_COMMUNITY): Payer: Self-pay

## 2023-10-04 ENCOUNTER — Ambulatory Visit (HOSPITAL_COMMUNITY)
Admission: EM | Admit: 2023-10-04 | Discharge: 2023-10-04 | Disposition: A | Discharge status: Home / Self Care | Attending: Emergency Medicine | Admitting: Emergency Medicine

## 2023-10-04 DIAGNOSIS — Z113 Encounter for screening for infections with a predominantly sexual mode of transmission: Secondary | ICD-10-CM

## 2023-10-04 NOTE — Discharge Instructions (Signed)
 Your results will come back over the next few days and someone will call if results are positive and require treatment.  Return here as needed.

## 2023-10-04 NOTE — ED Triage Notes (Signed)
 Patient presents with STD-testing with blood work.

## 2023-10-04 NOTE — ED Provider Notes (Signed)
 [facility]    CSN: [id number] Arrival date & time: 10/04/23  1858      History   Chief Complaint Chief Complaint  Patient presents with   SEXUALLY TRANSMITTED DISEASE    HPI Christopher Parker is a 23 y.o. male.   Patient presents requesting STD testing.  Patient denies any symptoms or known exposures.     Past Medical History:  Diagnosis Date   ADHD     There are no active problems to display for this patient.   History reviewed. No pertinent surgical history.     Home Medications    Prior to Admission medications   Medication Sig Start Date End Date Taking? Authorizing Provider  guaifenesin (HUMIBID E) 400 MG TABS tablet Take 1 tablet 3 times daily as needed for chest congestion and cough 06/26/23   Eloise Hake Scales, PA-C  Methylphenidate HCl ER 54 MG TB24 Take by mouth.    [provider]    Family History Family History  Problem Relation Age of Onset   Diabetes Mother     Social History Social History   Tobacco Use   Smoking status: Never   Smokeless tobacco: Never  Vaping Use   Vaping status: Never Used  Substance Use Topics   Alcohol use: Never   Drug use: Never     Allergies   Patient has no known allergies.   Review of Systems Review of Systems  Per HPI  Physical Exam Triage Vital Signs ED Triage Vitals  Encounter Vitals Group     BP 10/04/23 1945 101/75     Systolic BP Percentile --      Diastolic BP Percentile --      Pulse Rate 10/04/23 1945 74     Resp 10/04/23 1945 18     Temp 10/04/23 1945 98.3 F (36.8 C)     Temp Source 10/04/23 1945 Oral     SpO2 10/04/23 1945 98 %     Weight --      Height --      Head Circumference --      Peak Flow --      Pain Score 10/04/23 1946 0     Pain Loc --      Pain Education --      Exclude from Growth Chart --    No data found.  Updated Vital Signs BP 101/75 (BP Location: Left Arm)   Pulse 74   Temp 98.3 F (36.8 C) (Oral)   Resp 18   SpO2  98%   Visual Acuity Right Eye Distance:   Left Eye Distance:   Bilateral Distance:    Right Eye Near:   Left Eye Near:    Bilateral Near:     Physical Exam Vitals and nursing note reviewed.  Constitutional:      General: He is awake. He is not in acute distress.    Appearance: Normal appearance. He is well-developed and well-groomed. He is not ill-appearing.  Genitourinary:    Comments: Exam deferred Neurological:     Mental Status: He is alert.  Psychiatric:        Behavior: Behavior is cooperative.      UC Treatments / Results  Labs (all labs ordered are listed, but only abnormal results are displayed) Labs Reviewed  HIV ANTIBODY (ROUTINE TESTING W REFLEX)  RPR  CYTOLOGY, (ORAL, ANAL, URETHRAL) ANCILLARY ONLY    EKG   Radiology No results found.  Procedures Procedures (including critical care time)  Medications Ordered in UC Medications - No data to display  Initial Impression / Assessment and Plan / UC Course  I have reviewed the triage vital signs and the nursing notes.  Pertinent labs & imaging results that were available during my care of the patient were reviewed by me and considered in my medical decision making (see chart for details).     Patient is well-appearing.  Vitals are stable.  GU exam deferred, patient perform self swab for STD.  HIV and RPR ordered.  Discussed follow-up and return precautions. Final Clinical Impressions(s) / UC Diagnoses   Final diagnoses:  Screening for STD (sexually transmitted disease)     Discharge Instructions      Your results will come back over the next few days and someone will call if results are positive and require treatment.  Return here as needed.     ED Prescriptions   None    PDMP not reviewed this encounter.   Levora Reas A, NP 10/04/23 2018

## 2023-10-05 LAB — CYTOLOGY, (ORAL, ANAL, URETHRAL) ANCILLARY ONLY
Chlamydia: NEGATIVE
Comment: NEGATIVE
Comment: NEGATIVE
Comment: NORMAL
Neisseria Gonorrhea: NEGATIVE
Trichomonas: NEGATIVE

## 2023-10-05 LAB — RPR: RPR Ser Ql: NONREACTIVE

## 2023-10-06 LAB — MISC LABCORP TEST (SEND OUT): Labcorp test code: 83935

## 2023-10-31 ENCOUNTER — Ambulatory Visit

## 2024-06-25 ENCOUNTER — Emergency Department (HOSPITAL_COMMUNITY)
Admission: EM | Admit: 2024-06-25 | Discharge: 2024-06-25 | Disposition: A | Discharge status: Home / Self Care | Attending: Emergency Medicine | Admitting: Emergency Medicine

## 2024-06-25 ENCOUNTER — Encounter (HOSPITAL_COMMUNITY): Payer: Self-pay

## 2024-06-25 ENCOUNTER — Other Ambulatory Visit: Payer: Self-pay

## 2024-06-25 DIAGNOSIS — J111 Influenza due to unidentified influenza virus with other respiratory manifestations: Secondary | ICD-10-CM

## 2024-06-25 DIAGNOSIS — M791 Myalgia, unspecified site: Secondary | ICD-10-CM | POA: Diagnosis not present

## 2024-06-25 DIAGNOSIS — R519 Headache, unspecified: Secondary | ICD-10-CM | POA: Insufficient documentation

## 2024-06-25 DIAGNOSIS — R059 Cough, unspecified: Secondary | ICD-10-CM | POA: Diagnosis not present

## 2024-06-25 DIAGNOSIS — R509 Fever, unspecified: Secondary | ICD-10-CM | POA: Insufficient documentation

## 2024-06-25 LAB — CBC
HCT: 42.6 % (ref 39.0–52.0)
Hemoglobin: 13.9 g/dL (ref 13.0–17.0)
MCH: 28.8 pg (ref 26.0–34.0)
MCHC: 32.6 g/dL (ref 30.0–36.0)
MCV: 88.4 fL (ref 80.0–100.0)
Platelets: 255 K/uL (ref 150–400)
RBC: 4.82 MIL/uL (ref 4.22–5.81)
RDW: 15.1 % (ref 11.5–15.5)
WBC: 8.6 K/uL (ref 4.0–10.5)
nRBC: 0 % (ref 0.0–0.2)

## 2024-06-25 LAB — COMPREHENSIVE METABOLIC PANEL WITH GFR
ALT: 19 U/L (ref 0–44)
AST: 27 U/L (ref 15–41)
Albumin: 4.7 g/dL (ref 3.5–5.0)
Alkaline Phosphatase: 46 U/L (ref 38–126)
Anion gap: 12 (ref 5–15)
BUN: 6 mg/dL (ref 6–20)
CO2: 24 mmol/L (ref 22–32)
Calcium: 8.7 mg/dL — ABNORMAL LOW (ref 8.9–10.3)
Chloride: 100 mmol/L (ref 98–111)
Creatinine, Ser: 0.96 mg/dL (ref 0.61–1.24)
GFR, Estimated: >60 mL/min
Glucose, Bld: 92 mg/dL (ref 70–99)
Potassium: 3.7 mmol/L (ref 3.5–5.1)
Sodium: 136 mmol/L (ref 135–145)
Total Bilirubin: 0.6 mg/dL (ref 0.0–1.2)
Total Protein: 7.1 g/dL (ref 6.5–8.1)

## 2024-06-25 MED ORDER — SODIUM CHLORIDE 0.9 % IV BOLUS
1000.0000 mL | Freq: Once | INTRAVENOUS | Status: AC
  Administered 2024-06-25: 1000 mL via INTRAVENOUS

## 2024-06-25 MED ORDER — ONDANSETRON HCL 4 MG/2ML IJ SOLN
4.0000 mg | Freq: Once | INTRAMUSCULAR | Status: AC
  Administered 2024-06-25: 4 mg via INTRAVENOUS
  Filled 2024-06-25: qty 2

## 2024-06-25 MED ORDER — KETOROLAC TROMETHAMINE 15 MG/ML IJ SOLN
15.0000 mg | Freq: Once | INTRAMUSCULAR | Status: AC
  Administered 2024-06-25: 15 mg via INTRAVENOUS
  Filled 2024-06-25: qty 1

## 2024-06-25 MED ORDER — BENZONATATE 100 MG PO CAPS: 100.0000 mg | ORAL_CAPSULE | Freq: Three times a day (TID) | ORAL | 0 refills | Status: AC

## 2024-06-25 MED ORDER — NAPROXEN 500 MG PO TABS: 500.0000 mg | ORAL_TABLET | Freq: Two times a day (BID) | ORAL | 0 refills | Status: AC

## 2024-06-25 NOTE — ED Provider Notes (Signed)
 " [facility] Emergency Department Provider Note MRN:  [id number]  Arrival date & time: 06/25/2024     Chief Complaint   Generalized Body Aches   History of Present Illness   Christopher Parker is a 24 y.o. year-old male with no pertinent past medical history presenting to the ED with chief complaint of bodyaches.  1 or 2 days of bodyaches, fever, chills, cough, headache.  No chest pain or shortness of breath, no abdominal pain, no nausea vomiting or diarrhea.  Feels generally unwell.  Review of Systems  A thorough review of systems was obtained and all systems are negative except as noted in the HPI and PMH.   Patient's Health History    Past Medical History:  Diagnosis Date   ADHD     History reviewed. No pertinent surgical history.  Family History  Problem Relation Age of Onset   Diabetes Mother     Social History   Socioeconomic History   Marital status: Single    Spouse name: Not on file   Number of children: Not on file   Years of education: Not on file   Highest education level: Not on file  Occupational History   Not on file  Tobacco Use   Smoking status: Never   Smokeless tobacco: Never  Vaping Use   Vaping status: Never Used  Substance and Sexual Activity   Alcohol use: Never   Drug use: Never   Sexual activity: Not on file  Other Topics Concern   Not on file  Social History Narrative   Not on file   Social Drivers of Health   Tobacco Use: Low Risk (06/25/2024)   Patient History    Smoking Tobacco Use: Never    Smokeless Tobacco Use: Never    Passive Exposure: Not on file  Financial Resource Strain: Not on file  Food Insecurity: Not on file  Transportation Needs: Not on file  Physical Activity: Not on file  Stress: Not on file  Social Connections: Not on file  Intimate Partner Violence: Not on file  Depression (EYV7-0): Not on file  Alcohol Screen: Not on file  Housing: Not on file  Utilities: Not on file  Health  Literacy: Not on file     Physical Exam   Vitals:   06/25/24 0501  BP: 127/84  Pulse: 95  Resp: 14  Temp: 99.5 F (37.5 C)  SpO2: 100%    CONSTITUTIONAL: Well-appearing, NAD NEURO/PSYCH:  Alert and oriented x 3, no focal deficits EYES:  eyes equal and reactive ENT/NECK:  no LAD, no JVD CARDIO: Regular rate, well-perfused, normal S1 and S2 PULM:  CTAB no wheezing or rhonchi GI/GU:  non-distended, non-tender MSK/SPINE:  No gross deformities, no edema SKIN:  no rash, atraumatic   *Additional and/or pertinent findings included in MDM below  Diagnostic and Interventional Summary    EKG Interpretation Date/Time:    Ventricular Rate:    PR Interval:    QRS Duration:    QT Interval:    QTC Calculation:   R Axis:      Text Interpretation:         Labs Reviewed  CBC  COMPREHENSIVE METABOLIC PANEL WITH GFR    No orders to display    Medications  sodium chloride  0.9 % bolus 1,000 mL (1,000 mLs Intravenous New Bag/Given 06/25/24 0637)  ketorolac  (TORADOL ) 15 MG/ML injection 15 mg (15 mg Intravenous Given 06/25/24 0635)  ondansetron  (ZOFRAN ) injection 4 mg (4 mg Intravenous Given  06/25/24 9364)     Procedures  /  Critical Care Procedures  ED Course and Medical Decision Making  Initial Impression and Ddx Suspect flu or other viral illness.  Reassuring vital signs, lungs clear with no increased work of breathing, abdomen soft.  Patient feels dehydrated.  Providing clear fluids, checking basic labs.  Doubt emergent process.  Past medical/surgical history that increases complexity of ED encounter: None  Interpretation of Diagnostics Labs pending  Patient Reassessment and Ultimate Disposition/Management     Signed out to oncoming provider.  Anticipating discharge if reassuring workup.  Patient management required discussion with the following services or consulting groups:  None  Complexity of Problems Addressed Acute illness or injury that poses threat of life of  bodily function  Additional Data Reviewed and Analyzed Further history obtained from: Further history from spouse/family member  Additional Factors Impacting ED Encounter Risk Consideration of hospitalization  Ozell HERO. Theadore, MD [facility] Emergency Medicine [facility] [email or web address]  Final Clinical Impressions(s) / ED Diagnoses     ICD-10-CM   1. Influenza-like illness  J11.1       ED Discharge Orders          Ordered    naproxen  (NAPROSYN ) 500 MG tablet  2 times daily        06/25/24 0640    benzonatate  (TESSALON ) 100 MG capsule  Every 8 hours        06/25/24 0640             Discharge Instructions Discussed with and Provided to Patient:    Discharge Instructions      You were evaluated in the Emergency Department and after careful evaluation, we did not find any emergent condition requiring admission or further testing in the hospital.  Your exam/testing today is overall reassuring.  Please return to the Emergency Department if you experience any worsening of your condition.   Thank you for allowing us  to be a part of your care.      Theadore Ozell HERO, MD 06/25/24 [phone]  "

## 2024-06-25 NOTE — ED Triage Notes (Signed)
 Generalized body aches, cough, subjective fevers, and generalized not feeling well since yesterday morning.

## 2024-06-25 NOTE — Discharge Instructions (Signed)
You were evaluated in the Emergency Department and after careful evaluation, we did not find any emergent condition requiring admission or further testing in the hospital.  Your exam/testing today is overall reassuring.  Please return to the Emergency Department if you experience any worsening of your condition.   Thank you for allowing us to be a part of your care.
